# Patient Record
Sex: Female | Born: 1965 | Race: Black or African American | Hispanic: No | Marital: Single | Smoking: Current every day smoker
Health system: Southern US, Community
[De-identification: ages and names within clinical notes are randomized; demographics above are authoritative.]

## PROBLEM LIST (undated history)

## (undated) DIAGNOSIS — E119 Type 2 diabetes mellitus without complications: Secondary | ICD-10-CM

## (undated) DIAGNOSIS — I10 Essential (primary) hypertension: Secondary | ICD-10-CM

---

## 2014-12-08 ENCOUNTER — Encounter (HOSPITAL_COMMUNITY): Payer: Self-pay

## 2014-12-08 ENCOUNTER — Emergency Department (HOSPITAL_COMMUNITY)
Admission: EM | Admit: 2014-12-08 | Discharge: 2014-12-09 | Disposition: A | Discharge status: Home / Self Care | Payer: Medicare Other | Attending: Emergency Medicine | Admitting: Emergency Medicine

## 2014-12-08 DIAGNOSIS — R112 Nausea with vomiting, unspecified: Secondary | ICD-10-CM | POA: Diagnosis present

## 2014-12-08 DIAGNOSIS — E119 Type 2 diabetes mellitus without complications: Secondary | ICD-10-CM | POA: Insufficient documentation

## 2014-12-08 DIAGNOSIS — Z720 Tobacco use: Secondary | ICD-10-CM | POA: Diagnosis not present

## 2014-12-08 DIAGNOSIS — R61 Generalized hyperhidrosis: Secondary | ICD-10-CM | POA: Diagnosis not present

## 2014-12-08 DIAGNOSIS — R109 Unspecified abdominal pain: Secondary | ICD-10-CM | POA: Diagnosis not present

## 2014-12-08 DIAGNOSIS — Z880 Allergy status to penicillin: Secondary | ICD-10-CM | POA: Insufficient documentation

## 2014-12-08 DIAGNOSIS — I10 Essential (primary) hypertension: Secondary | ICD-10-CM | POA: Diagnosis not present

## 2014-12-08 DIAGNOSIS — R079 Chest pain, unspecified: Secondary | ICD-10-CM | POA: Diagnosis not present

## 2014-12-08 DIAGNOSIS — R6883 Chills (without fever): Secondary | ICD-10-CM | POA: Insufficient documentation

## 2014-12-08 HISTORY — DX: Type 2 diabetes mellitus without complications: E11.9

## 2014-12-08 HISTORY — DX: Essential (primary) hypertension: I10

## 2014-12-08 MED ORDER — HYDROMORPHONE HCL 1 MG/ML IJ SOLN
2.0000 mg | Freq: Once | INTRAMUSCULAR | Status: DC
  Filled 2014-12-08: qty 2

## 2014-12-08 MED ORDER — SODIUM CHLORIDE 0.9 % IV BOLUS (SEPSIS)
2000.0000 mL | Freq: Once | INTRAVENOUS | Status: AC
Start: 2014-12-08 — End: 2014-12-09
  Administered 2014-12-09: 2000 mL via INTRAVENOUS

## 2014-12-08 MED ORDER — METOCLOPRAMIDE HCL 5 MG/ML IJ SOLN
10.0000 mg | Freq: Once | INTRAMUSCULAR | Status: DC
  Filled 2014-12-08: qty 2

## 2014-12-08 NOTE — ED Provider Notes (Signed)
CSN: [id number]     Arrival date & time 12/08/14  2219 History   This chart was scribed for Azalia Bilis, MD by Arlan Organ, ED Scribe. This patient was seen in room A01C/A01C and the patient's care was started 11:19 PM.   Chief Complaint  Patient presents with  . Hypertension  . Chest Pain  . Nausea   The history is provided by the patient. No language interpreter was used.    HPI Comments: Christie Frank brought in by EMS is a 49 y.o. female with a PMHx of HTN and DM who presents to the Emergency Department complaining of intermittent, recurrent vomiting x few months; but constant in last 7-8 hours. Reports 7 episodes since time of onset today. She also reports nausea, diaphoresis, abdominal pain, cold chills, and chest discomfort located underneath her L breast x 1830. Currently pain is rated 10/10 and does not radiate. No aggravating or alleviating factors at this time. No OTC medications or home remedies attempted prior to arrival. Christie Frank was hypertensive upon EMS arrival at 216/130. She denies missing any doses of daily prescribed medications. No recent fever. Endoscopy performed 1 month ago without any pertinent findings. However, pt states a mass was found on her kidney recently after imaging studies. Pt with known allergy to Penicillins.  Past Medical History  Diagnosis Date  . Diabetes mellitus without complication   . Hypertension    History reviewed. No pertinent past surgical history. History reviewed. No pertinent family history. Social History  Substance Use Topics  . Smoking status: Current Every Day Smoker -- 0.50 packs/day    Types: Cigarettes  . Smokeless tobacco: None  . Alcohol Use: Yes     Comment: twice per week   OB History    No data available     Review of Systems  Constitutional: Positive for chills and diaphoresis. Negative for fever.  Respiratory: Negative for cough and shortness of breath.   Cardiovascular: Positive for chest pain.   Gastrointestinal: Positive for nausea, vomiting and abdominal pain. Negative for diarrhea.  Musculoskeletal: Negative for back pain.  Neurological: Negative for headaches.  Psychiatric/Behavioral: Negative for confusion.  All other systems reviewed and are negative.     Allergies  Penicillins  Home Medications   Prior to Admission medications   Not on File   Triage Vitals: BP 236/134 mmHg  Pulse 108  Temp(Src) 99 F (37.2 C) (Oral)  Resp 24  Ht 5\' 4"  (1.626 m)  Wt 200 lb (90.719 kg)  BMI 34.31 kg/m2  SpO2 100%   Physical Exam  Constitutional: She is oriented to person, place, and time. She appears well-developed and well-nourished. No distress.  HENT:  Head: Normocephalic and atraumatic.  Eyes: EOM are normal.  Neck: Normal range of motion.  Cardiovascular: Normal rate, regular rhythm and normal heart sounds.   Pulmonary/Chest: Effort normal and breath sounds normal.  Abdominal: Soft. She exhibits no distension. There is no tenderness.  Musculoskeletal: Normal range of motion.  Neurological: She is alert and oriented to person, place, and time.  Skin: Skin is warm and dry.  Psychiatric: She has a normal mood and affect. Judgment normal.  Nursing note and vitals reviewed.   ED Course  Procedures (including critical care time)  DIAGNOSTIC STUDIES: Oxygen Saturation is 100% on RA, Normal by my interpretation.    COORDINATION OF CARE: 11:28 PM- Will order BMP, CBC, EKG, i-stat troponin I. Will give fluids, Dilaudid, and Reglan. Discussed treatment plan with pt at bedside and  pt agreed to plan.     Labs Review Labs Reviewed  CBC - Abnormal; Notable for the following:    WBC 15.2 (*)    RBC 5.73 (*)    Hemoglobin 16.8 (*)    HCT 48.4 (*)    Platelets 424 (*)    All other components within normal limits  COMPREHENSIVE METABOLIC PANEL - Abnormal; Notable for the following:    Chloride 96 (*)    CO2 20 (*)    Glucose, Bld 309 (*)    Creatinine, Ser 1.12 (*)     Calcium 11.0 (*)    Total Protein 10.4 (*)    Albumin 5.3 (*)    GFR calc non Af Amer 56 (*)    Anion gap 20 (*)    All other components within normal limits  URINALYSIS, ROUTINE W REFLEX MICROSCOPIC (NOT AT [facility]) - Abnormal; Notable for the following:    APPearance CLOUDY (*)    Glucose, UA 500 (*)    Hgb urine dipstick TRACE (*)    Protein, ur >300 (*)    All other components within normal limits  URINE MICROSCOPIC-ADD ON - Abnormal; Notable for the following:    Bacteria, UA FEW (*)    All other components within normal limits  I-STAT VENOUS BLOOD GAS, ED - Abnormal; Notable for the following:    pH, Ven 7.490 (*)    pCO2, Ven 30.3 (*)    pO2, Ven 28.0 (*)    All other components within normal limits  LIPASE, BLOOD  TROPONIN I  HCG, SERUM, QUALITATIVE  I-STAT BETA HCG BLOOD, ED ([facility], [facility], [facility] ONLY)    Imaging Review Dg Chest 2 View  12/09/2014   CLINICAL DATA:  Acute onset of left-sided chest discomfort, with nausea and high blood pressure. Initial encounter.  EXAM: CHEST  2 VIEW  COMPARISON:  None.  FINDINGS: The lungs are well-aerated and clear. There is no evidence of focal opacification, pleural effusion or pneumothorax.  The heart is normal in size; the mediastinal contour is within normal limits. No acute osseous abnormalities are seen.  IMPRESSION: No acute cardiopulmonary process seen. No displaced rib fractures identified.   Electronically Signed   By: Roanna Raider M.D.   On: 12/09/2014 01:33   Dg Abd 2 Views  12/09/2014   CLINICAL DATA:  Acute onset of left-sided chest discomfort, with nausea and high blood pressure. Vomiting and generalized abdominal pain. Initial encounter.  EXAM: ABDOMEN - 2 VIEW  COMPARISON:  None.  FINDINGS: The visualized bowel gas pattern is unremarkable. Scattered air and stool filled loops of colon are seen; no abnormal dilatation of small bowel loops is seen to suggest small bowel obstruction. No free intra-abdominal air is identified on the  provided decubitus view.  The visualized osseous structures are within normal limits; the sacroiliac joints are unremarkable in appearance. The visualized lung bases are essentially clear.  IMPRESSION: Unremarkable bowel gas pattern; no free intra-abdominal air seen. Small to moderate amount of stool noted in the colon.   Electronically Signed   By: Roanna Raider M.D.   On: 12/09/2014 01:34   I have personally reviewed and evaluated these images and lab results as part of my medical decision-making.   EKG Interpretation   Date/Time:  Thursday December 08 2014 23:12:48 EDT Ventricular Rate:  119 PR Interval:  102 QRS Duration: 99 QT Interval:  384 QTC Calculation: 540 R Axis:   52 Text Interpretation:  Sinus tachycardia Probable left atrial enlargement  Low voltage, precordial leads Repol abnrm, severe global ischemia (LM/MVD)  Prolonged QT interval Baseline wander in lead(s) II III aVR aVF V1 No old  tracing to compare Confirmed by Cayce Quezada  MD, Caryn Bee ([id number]) on 12/09/2014  4:18:00 AM      MDM   Final diagnoses:  Nausea and vomiting, vomiting of unspecified type  Abdominal pain, unspecified abdominal location    Patient has a long-standing history of recurrent abdominal pain associated nausea vomiting.  Recently hospitalized in outside hospital.  Patient underwent endoscopy and colonoscopy as well as gastric emptying study.  While here in emergency department she seems to be feeling better after symptomatic treatment.  Her labs are without significant abnormality.  She underwent plain films without acute pathology.  I reviewed her records from the outside hospital which demonstrates normal endoscopy and colonoscopy.  Patient will follow up with GI as an outpatient.  She understands to return to the ER for any new or worsening symptoms   I personally performed the services described in this documentation, which was scribed in my presence. The recorded information has been reviewed and is  accurate.      Azalia Bilis, MD 12/09/14 [phone]

## 2014-12-08 NOTE — ED Notes (Signed)
Unable to get EKG on pt d/t movement and pt is clammy and cool to touch

## 2014-12-08 NOTE — ED Notes (Signed)
Stuck pt twice and EMS stuck her twice without success, informed Dr. Patria Mane

## 2014-12-08 NOTE — ED Notes (Signed)
Per EMS, pt began having acute onset of chest discomfort under the left breast around 1830 this evening with nausea, paramedics at the [facility] took her and did an ekg and 12 unremarkable however pt was hypertensive 216/130. Pt has hx of htn, dm. Pt cold to the touch. Pt vomited 7 times this evening. EMS VS 216//130, CBG 250, HR 108, RR 20, Spo2 98% on RA. Pain 10/10 under the left breast non radiating and also complains of a headache without blurred vision, stroke screen with EMS negative.

## 2014-12-09 ENCOUNTER — Emergency Department (HOSPITAL_COMMUNITY): Payer: Medicare Other

## 2014-12-09 DIAGNOSIS — R112 Nausea with vomiting, unspecified: Secondary | ICD-10-CM | POA: Diagnosis not present

## 2014-12-09 LAB — CBC
HCT: 48.4 % — ABNORMAL HIGH (ref 36.0–46.0)
Hemoglobin: 16.8 g/dL — ABNORMAL HIGH (ref 12.0–15.0)
MCH: 29.3 pg (ref 26.0–34.0)
MCHC: 34.7 g/dL (ref 30.0–36.0)
MCV: 84.5 fL (ref 78.0–100.0)
PLATELETS: 424 10*3/uL — AB (ref 150–400)
RBC: 5.73 MIL/uL — ABNORMAL HIGH (ref 3.87–5.11)
RDW: 12.7 % (ref 11.5–15.5)
WBC: 15.2 10*3/uL — ABNORMAL HIGH (ref 4.0–10.5)

## 2014-12-09 LAB — I-STAT VENOUS BLOOD GAS, ED
ACID-BASE EXCESS: 1 mmol/L (ref 0.0–2.0)
BICARBONATE: 23.1 meq/L (ref 20.0–24.0)
O2 Saturation: 61 %
TCO2: 24 mmol/L (ref 0–100)
pCO2, Ven: 30.3 mmHg — ABNORMAL LOW (ref 45.0–50.0)
pH, Ven: 7.49 — ABNORMAL HIGH (ref 7.250–7.300)
pO2, Ven: 28 mmHg — CL (ref 30.0–45.0)

## 2014-12-09 LAB — URINALYSIS, ROUTINE W REFLEX MICROSCOPIC
Bilirubin Urine: NEGATIVE
GLUCOSE, UA: 500 mg/dL — AB
KETONES UR: NEGATIVE mg/dL
LEUKOCYTES UA: NEGATIVE
NITRITE: NEGATIVE
Specific Gravity, Urine: 1.013 (ref 1.005–1.030)
UROBILINOGEN UA: 0.2 mg/dL (ref 0.0–1.0)
pH: 8 (ref 5.0–8.0)

## 2014-12-09 LAB — COMPREHENSIVE METABOLIC PANEL
ALT: 16 U/L (ref 14–54)
ANION GAP: 20 — AB (ref 5–15)
AST: 26 U/L (ref 15–41)
Albumin: 5.3 g/dL — ABNORMAL HIGH (ref 3.5–5.0)
Alkaline Phosphatase: 117 U/L (ref 38–126)
BILIRUBIN TOTAL: 0.9 mg/dL (ref 0.3–1.2)
BUN: 12 mg/dL (ref 6–20)
CO2: 20 mmol/L — ABNORMAL LOW (ref 22–32)
Calcium: 11 mg/dL — ABNORMAL HIGH (ref 8.9–10.3)
Chloride: 96 mmol/L — ABNORMAL LOW (ref 101–111)
Creatinine, Ser: 1.12 mg/dL — ABNORMAL HIGH (ref 0.44–1.00)
GFR, EST NON AFRICAN AMERICAN: 56 mL/min — AB (ref >60)
Glucose, Bld: 309 mg/dL — ABNORMAL HIGH (ref 65–99)
POTASSIUM: 4.4 mmol/L (ref 3.5–5.1)
Sodium: 136 mmol/L (ref 135–145)
TOTAL PROTEIN: 10.4 g/dL — AB (ref 6.5–8.1)

## 2014-12-09 LAB — URINE MICROSCOPIC-ADD ON

## 2014-12-09 LAB — LIPASE, BLOOD: LIPASE: 27 U/L (ref 22–51)

## 2014-12-09 LAB — I-STAT BETA HCG BLOOD, ED (MC, WL, AP ONLY)

## 2014-12-09 LAB — TROPONIN I

## 2014-12-09 MED ORDER — METOCLOPRAMIDE HCL 5 MG/ML IJ SOLN
10.0000 mg | Freq: Once | INTRAMUSCULAR | Status: AC
  Administered 2014-12-09: 10 mg via INTRAVENOUS

## 2014-12-09 MED ORDER — LORAZEPAM 2 MG/ML IJ SOLN
1.0000 mg | Freq: Once | INTRAMUSCULAR | Status: AC
  Administered 2014-12-09: 1 mg via INTRAVENOUS
  Filled 2014-12-09: qty 1

## 2014-12-09 MED ORDER — ONDANSETRON 8 MG PO TBDP: 8.0000 mg | ORAL_TABLET | Freq: Three times a day (TID) | ORAL | Status: AC | PRN

## 2014-12-09 MED ORDER — HYDROMORPHONE HCL 1 MG/ML IJ SOLN
1.0000 mg | Freq: Once | INTRAMUSCULAR | Status: AC
  Administered 2014-12-09: 1 mg via INTRAVENOUS
  Filled 2014-12-09: qty 1

## 2014-12-09 MED ORDER — METOCLOPRAMIDE HCL 10 MG PO TABS: 10.0000 mg | ORAL_TABLET | Freq: Four times a day (QID) | ORAL | Status: AC

## 2014-12-09 MED ORDER — DICYCLOMINE HCL 20 MG PO TABS: 20.0000 mg | ORAL_TABLET | Freq: Two times a day (BID) | ORAL | Status: AC

## 2014-12-09 MED ORDER — HYDROMORPHONE HCL 1 MG/ML IJ SOLN
1.0000 mg | Freq: Once | INTRAMUSCULAR | Status: AC
  Administered 2014-12-09: 1 mg via INTRAVENOUS

## 2014-12-09 MED ORDER — DICYCLOMINE HCL 10 MG PO CAPS
10.0000 mg | ORAL_CAPSULE | Freq: Once | ORAL | Status: AC
  Administered 2014-12-09: 10 mg via ORAL
  Filled 2014-12-09: qty 1

## 2014-12-09 NOTE — Discharge Instructions (Signed)

## 2016-09-24 IMAGING — CR DG CHEST 2V
2 series · 2 of 2 positions shown · non-contrast
Comparison: None.

CLINICAL DATA: Acute onset of left-sided chest discomfort, with
nausea and high blood pressure. Initial encounter.

EXAM:
CHEST  2 VIEW

[chest lat]
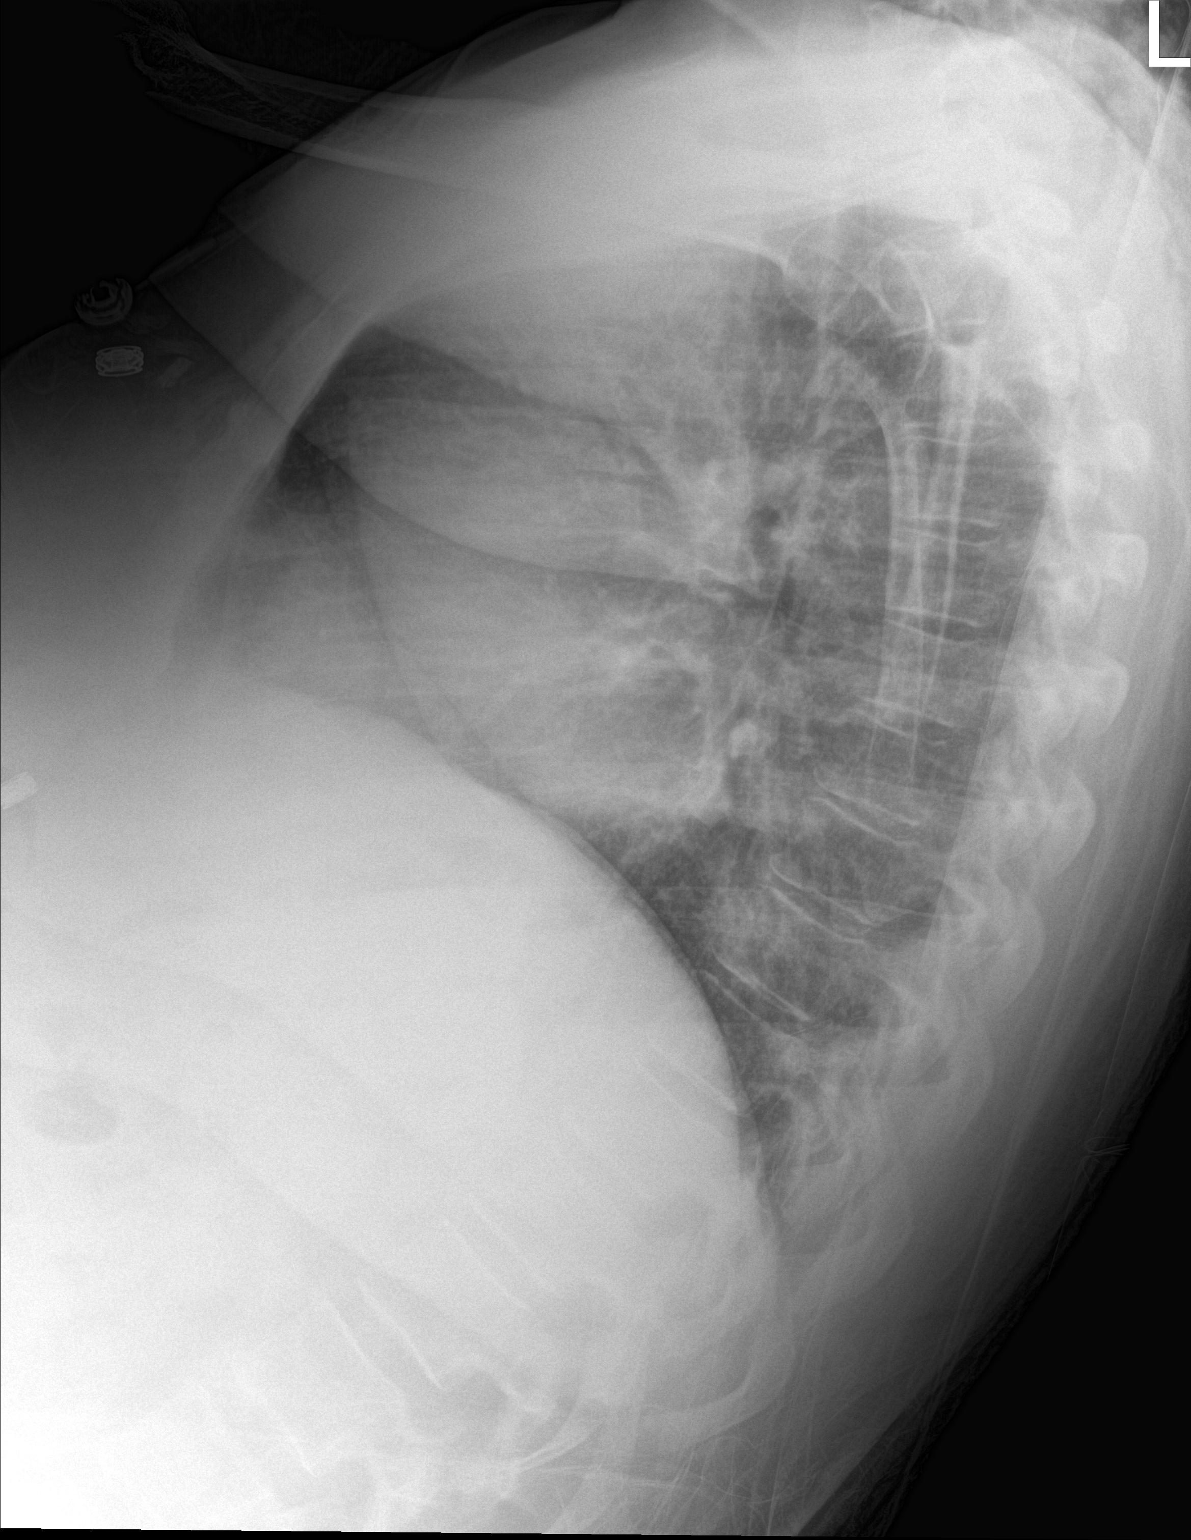

[chest ap]
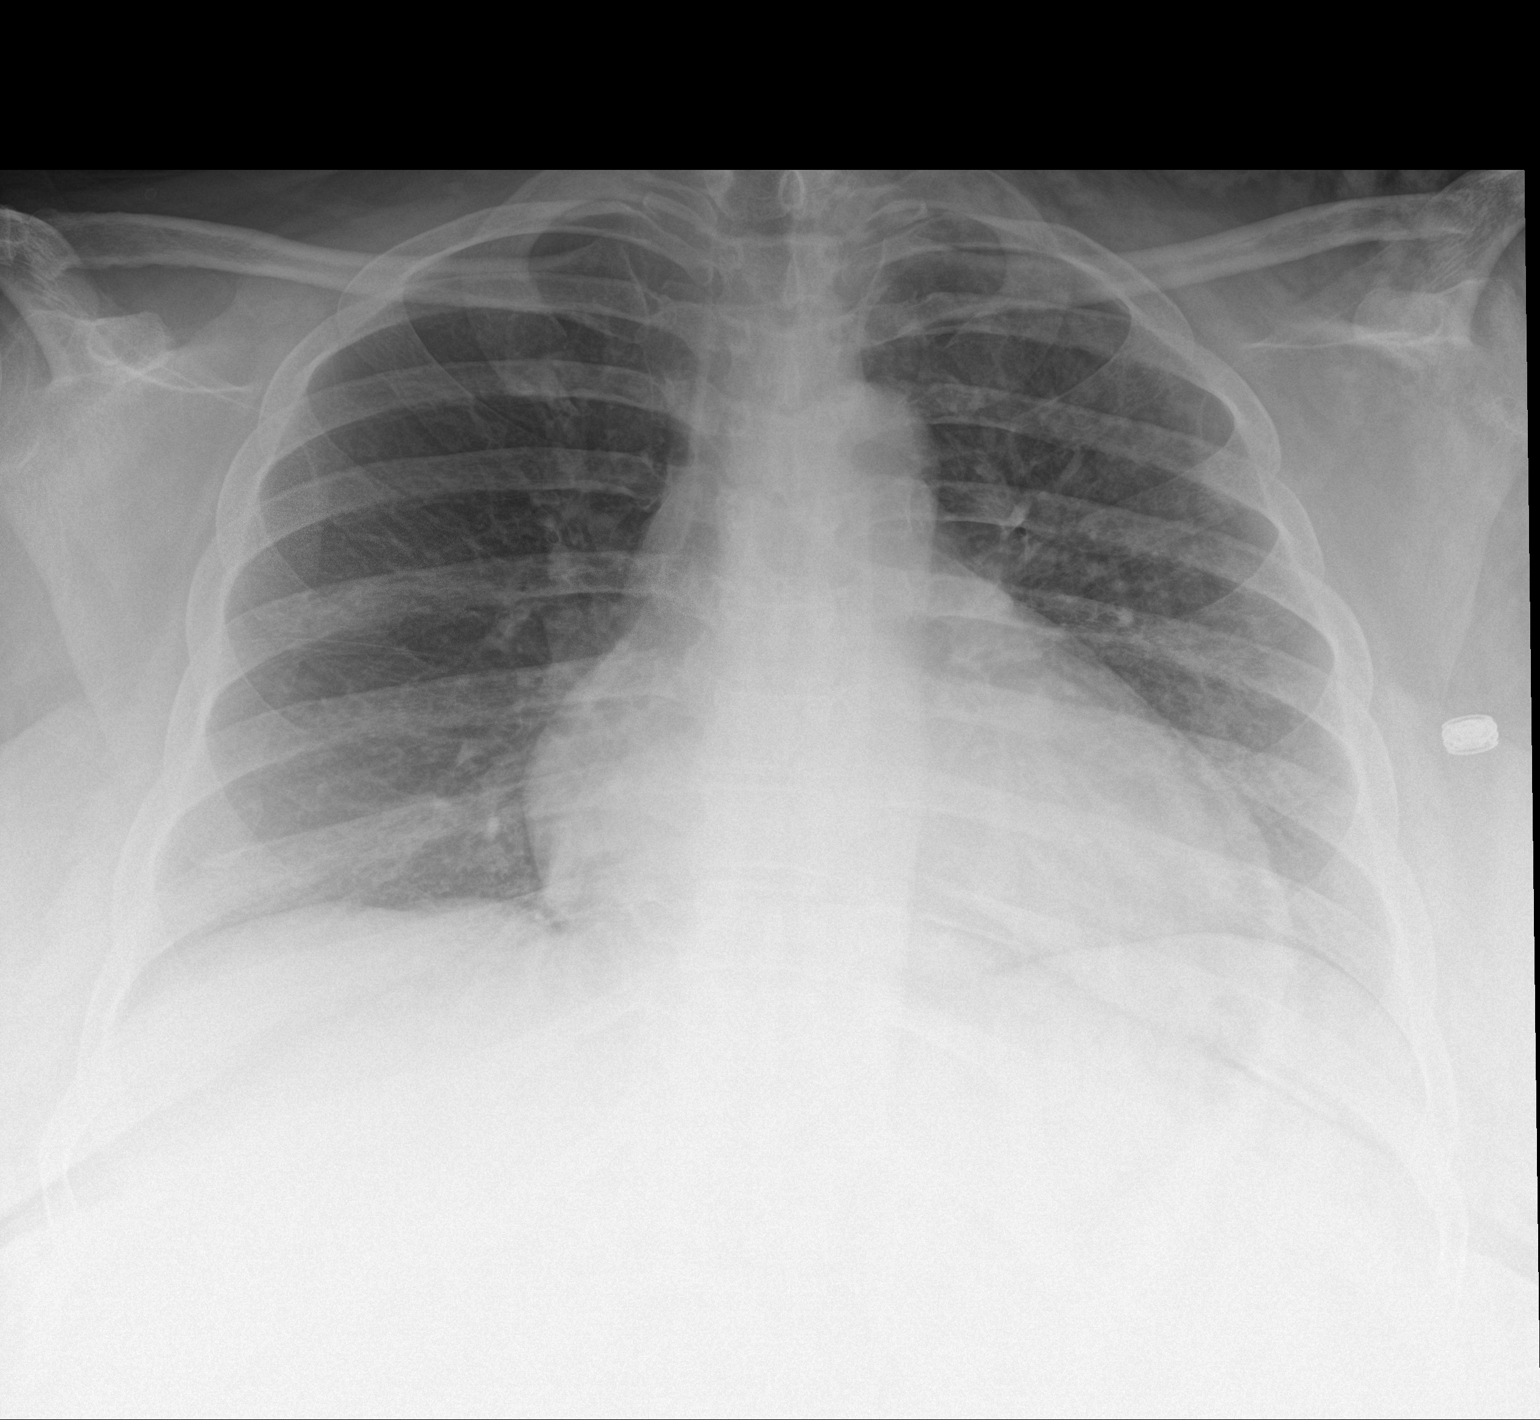

[2 of 2 positions shown; findings below may reference images not displayed]

FINDINGS: The lungs are well-aerated and clear. There is no evidence of focal
opacification, pleural effusion or pneumothorax.

The heart is normal in size; the mediastinal contour is within
normal limits. No acute osseous abnormalities are seen.
IMPRESSION: No acute cardiopulmonary process seen. No displaced rib fractures
identified.

## 2016-09-24 IMAGING — CR DG ABDOMEN 2V
2 series · 2 of 2 positions shown · non-contrast
Comparison: None.

CLINICAL DATA: Acute onset of left-sided chest discomfort, with
nausea and high blood pressure. Vomiting and generalized abdominal
pain. Initial encounter.

EXAM:
ABDOMEN - 2 VIEW

[abdomen supine]
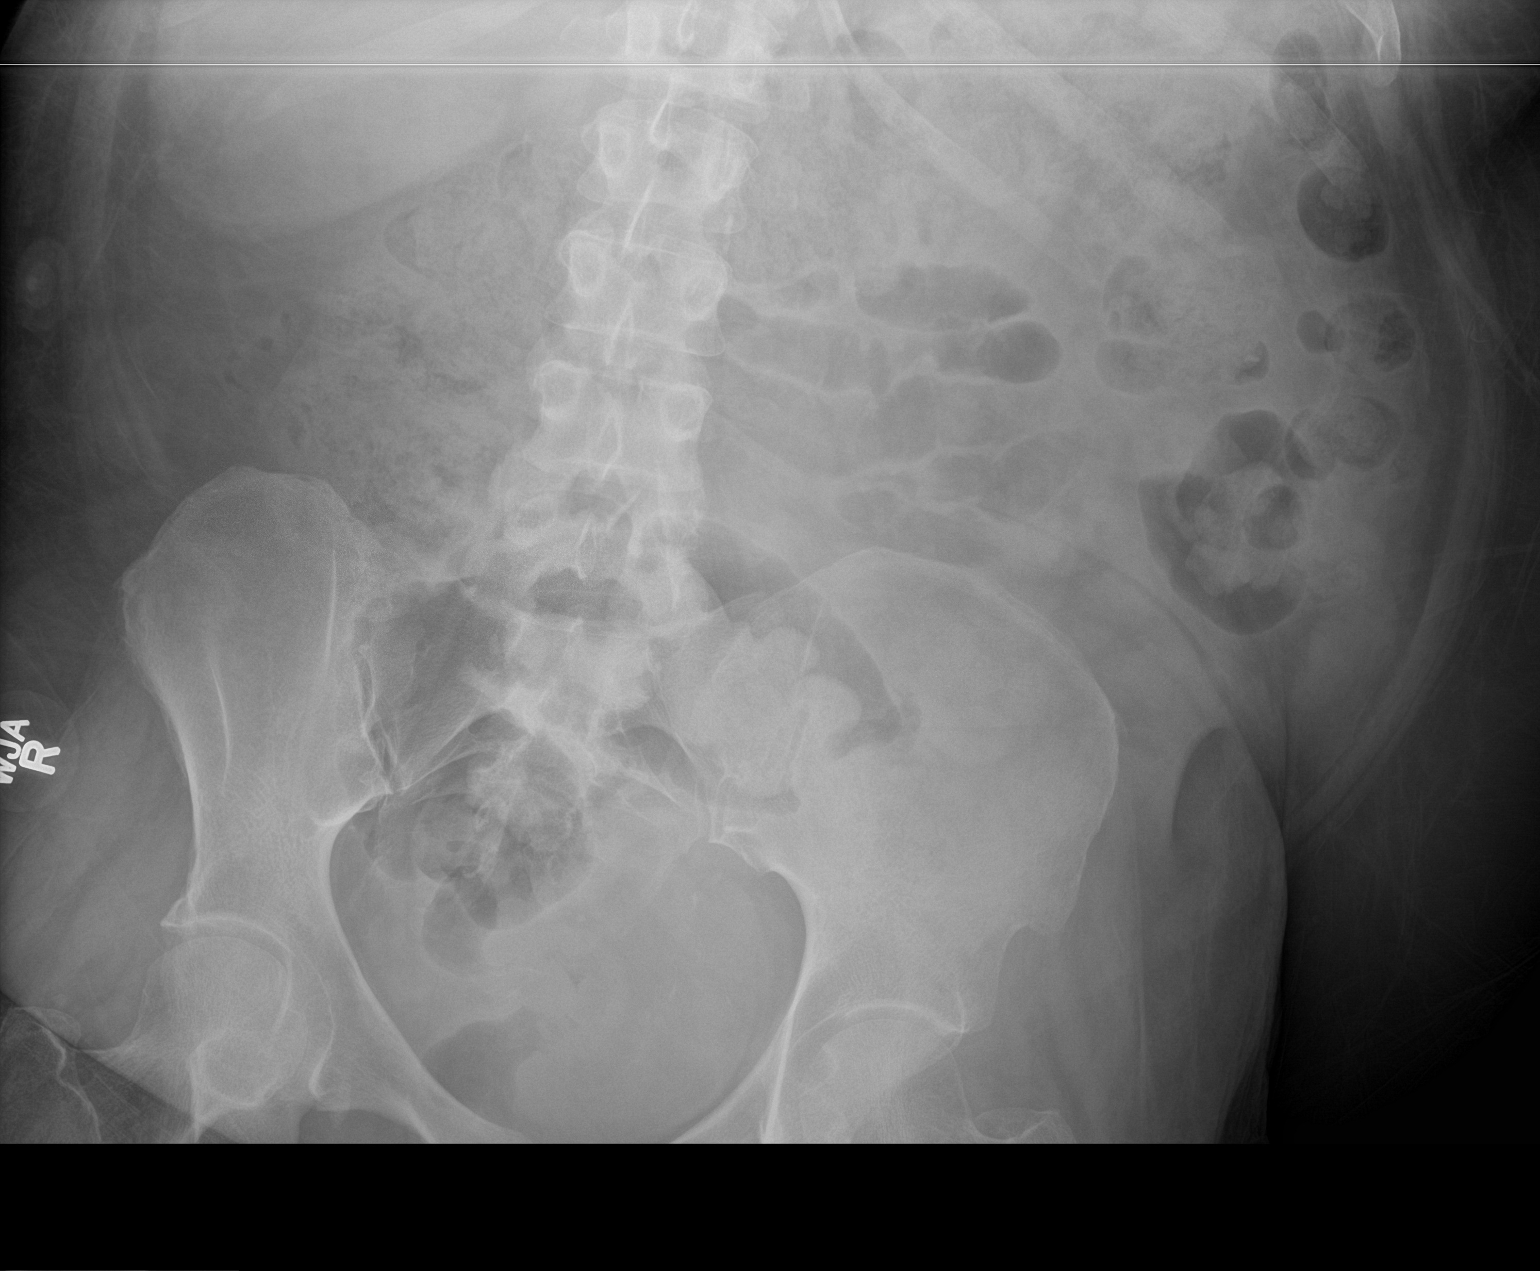

[abdomen decu]
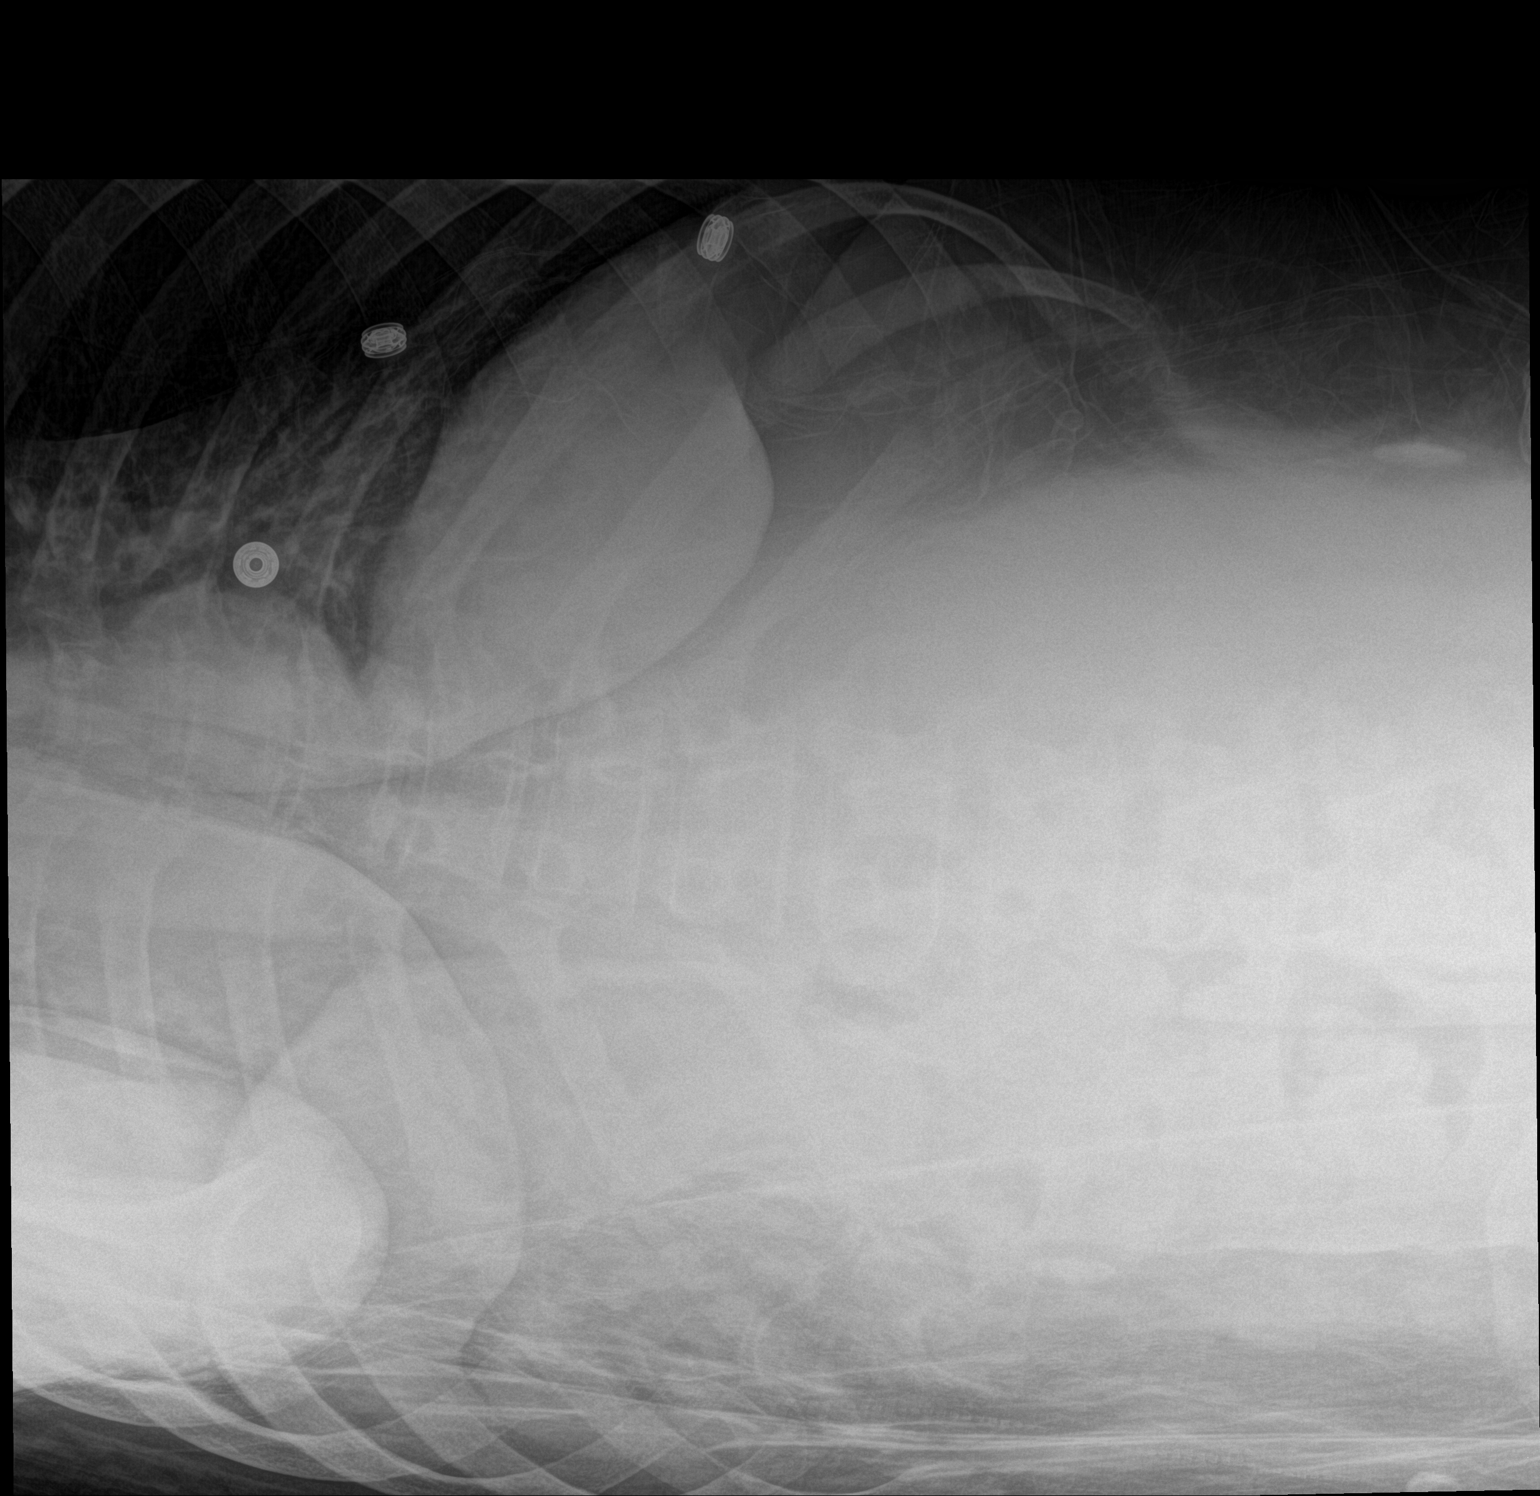

[2 of 2 positions shown; findings below may reference images not displayed]

FINDINGS: The visualized bowel gas pattern is unremarkable. Scattered air and
stool filled loops of colon are seen; no abnormal dilatation of
small bowel loops is seen to suggest small bowel obstruction. No
free intra-abdominal air is identified on the provided decubitus
view.

The visualized osseous structures are within normal limits; the
sacroiliac joints are unremarkable in appearance. The visualized
lung bases are essentially clear.
IMPRESSION: Unremarkable bowel gas pattern; no free intra-abdominal air seen.
Small to moderate amount of stool noted in the colon.

## 2018-03-22 DEATH — deceased
